# Patient Record
Sex: Female | Born: 1990 | Hispanic: No | Marital: Married | Smoking: Never smoker
Health system: Southern US, Community
[De-identification: ages and names within clinical notes are randomized; demographics above are authoritative.]

## PROBLEM LIST (undated history)

## (undated) DIAGNOSIS — E041 Nontoxic single thyroid nodule: Secondary | ICD-10-CM

## (undated) DIAGNOSIS — J45909 Unspecified asthma, uncomplicated: Secondary | ICD-10-CM

## (undated) HISTORY — PX: THYROIDECTOMY, PARTIAL: SHX18

---

## 2017-08-26 ENCOUNTER — Encounter (HOSPITAL_COMMUNITY): Payer: Self-pay

## 2017-09-10 ENCOUNTER — Other Ambulatory Visit (HOSPITAL_COMMUNITY): Payer: Self-pay | Admitting: Specialist

## 2017-09-10 DIAGNOSIS — Z3A18 18 weeks gestation of pregnancy: Secondary | ICD-10-CM

## 2017-09-10 DIAGNOSIS — O30042 Twin pregnancy, dichorionic/diamniotic, second trimester: Secondary | ICD-10-CM

## 2017-09-10 DIAGNOSIS — Z3689 Encounter for other specified antenatal screening: Secondary | ICD-10-CM

## 2017-10-09 ENCOUNTER — Encounter (HOSPITAL_COMMUNITY): Payer: Self-pay | Admitting: *Deleted

## 2017-10-13 ENCOUNTER — Encounter (HOSPITAL_COMMUNITY): Payer: Self-pay

## 2017-10-13 ENCOUNTER — Ambulatory Visit (HOSPITAL_COMMUNITY)
Admission: RE | Admit: 2017-10-13 | Discharge: 2017-10-13 | Disposition: A | Discharge status: Home / Self Care | Payer: PRIVATE HEALTH INSURANCE | Source: Ambulatory Visit | Attending: Specialist | Admitting: Specialist

## 2017-10-13 ENCOUNTER — Other Ambulatory Visit (HOSPITAL_COMMUNITY): Payer: Self-pay | Admitting: *Deleted

## 2017-10-13 DIAGNOSIS — Z363 Encounter for antenatal screening for malformations: Secondary | ICD-10-CM | POA: Insufficient documentation

## 2017-10-13 DIAGNOSIS — O30042 Twin pregnancy, dichorionic/diamniotic, second trimester: Secondary | ICD-10-CM | POA: Diagnosis present

## 2017-10-13 DIAGNOSIS — Z3A18 18 weeks gestation of pregnancy: Secondary | ICD-10-CM | POA: Insufficient documentation

## 2017-10-13 DIAGNOSIS — O30049 Twin pregnancy, dichorionic/diamniotic, unspecified trimester: Secondary | ICD-10-CM

## 2017-10-13 DIAGNOSIS — Z3689 Encounter for other specified antenatal screening: Secondary | ICD-10-CM

## 2017-10-13 HISTORY — DX: Nontoxic single thyroid nodule: E04.1

## 2017-10-13 HISTORY — DX: Unspecified asthma, uncomplicated: J45.909

## 2017-10-13 NOTE — Addendum Note (Signed)
Encounter addended by: Levonne Hubert, RDMS, RVT on: 10/13/2017 3:33 PM  Actions taken: Imaging Exam ended

## 2017-11-03 ENCOUNTER — Other Ambulatory Visit (HOSPITAL_COMMUNITY): Payer: Self-pay | Admitting: *Deleted

## 2017-11-04 ENCOUNTER — Other Ambulatory Visit (HOSPITAL_COMMUNITY): Payer: Self-pay | Admitting: *Deleted

## 2017-11-04 ENCOUNTER — Other Ambulatory Visit (HOSPITAL_COMMUNITY): Payer: Self-pay | Admitting: Obstetrics and Gynecology

## 2017-11-04 ENCOUNTER — Encounter (HOSPITAL_COMMUNITY): Payer: Self-pay

## 2017-11-04 ENCOUNTER — Ambulatory Visit (HOSPITAL_COMMUNITY)
Admission: RE | Admit: 2017-11-04 | Discharge: 2017-11-04 | Disposition: A | Discharge status: Home / Self Care | Payer: PRIVATE HEALTH INSURANCE | Source: Ambulatory Visit | Attending: Specialist | Admitting: Specialist

## 2017-11-04 DIAGNOSIS — Z3686 Encounter for antenatal screening for cervical length: Secondary | ICD-10-CM | POA: Diagnosis not present

## 2017-11-04 DIAGNOSIS — Z3A21 21 weeks gestation of pregnancy: Secondary | ICD-10-CM | POA: Diagnosis not present

## 2017-11-04 DIAGNOSIS — Z362 Encounter for other antenatal screening follow-up: Secondary | ICD-10-CM | POA: Diagnosis not present

## 2017-11-04 DIAGNOSIS — O30042 Twin pregnancy, dichorionic/diamniotic, second trimester: Secondary | ICD-10-CM

## 2017-11-04 DIAGNOSIS — O30049 Twin pregnancy, dichorionic/diamniotic, unspecified trimester: Secondary | ICD-10-CM

## 2017-11-04 NOTE — Addendum Note (Signed)
Encounter addended by: Lenise Arena, RT on: 11/04/2017 9:40 AM  Actions taken: Imaging Exam ended

## 2017-12-01 ENCOUNTER — Ambulatory Visit (HOSPITAL_COMMUNITY): Payer: PRIVATE HEALTH INSURANCE

## 2017-12-02 ENCOUNTER — Other Ambulatory Visit (HOSPITAL_COMMUNITY): Payer: Self-pay | Admitting: *Deleted

## 2017-12-02 ENCOUNTER — Ambulatory Visit (HOSPITAL_COMMUNITY): Payer: PRIVATE HEALTH INSURANCE

## 2017-12-02 ENCOUNTER — Encounter (HOSPITAL_COMMUNITY): Payer: Self-pay

## 2017-12-02 ENCOUNTER — Ambulatory Visit (HOSPITAL_COMMUNITY)
Admission: RE | Admit: 2017-12-02 | Discharge: 2017-12-02 | Disposition: A | Discharge status: Home / Self Care | Payer: PRIVATE HEALTH INSURANCE | Source: Ambulatory Visit | Attending: Specialist | Admitting: Specialist

## 2017-12-02 DIAGNOSIS — O30042 Twin pregnancy, dichorionic/diamniotic, second trimester: Secondary | ICD-10-CM | POA: Diagnosis present

## 2017-12-02 DIAGNOSIS — Z3A25 25 weeks gestation of pregnancy: Secondary | ICD-10-CM | POA: Insufficient documentation

## 2017-12-02 DIAGNOSIS — O30043 Twin pregnancy, dichorionic/diamniotic, third trimester: Secondary | ICD-10-CM

## 2017-12-30 ENCOUNTER — Other Ambulatory Visit (HOSPITAL_COMMUNITY): Payer: Self-pay | Admitting: Maternal & Fetal Medicine

## 2017-12-30 ENCOUNTER — Ambulatory Visit (HOSPITAL_COMMUNITY)
Admission: RE | Admit: 2017-12-30 | Discharge: 2017-12-30 | Disposition: A | Discharge status: Home / Self Care | Payer: PRIVATE HEALTH INSURANCE | Source: Ambulatory Visit | Attending: Specialist | Admitting: Specialist

## 2017-12-30 ENCOUNTER — Other Ambulatory Visit (HOSPITAL_COMMUNITY): Payer: Self-pay | Admitting: *Deleted

## 2017-12-30 ENCOUNTER — Encounter (HOSPITAL_COMMUNITY): Payer: Self-pay

## 2017-12-30 DIAGNOSIS — O30043 Twin pregnancy, dichorionic/diamniotic, third trimester: Secondary | ICD-10-CM

## 2017-12-30 DIAGNOSIS — Z3A29 29 weeks gestation of pregnancy: Secondary | ICD-10-CM | POA: Diagnosis not present

## 2018-01-27 ENCOUNTER — Ambulatory Visit (HOSPITAL_COMMUNITY)
Admission: RE | Admit: 2018-01-27 | Discharge: 2018-01-27 | Disposition: A | Discharge status: Home / Self Care | Payer: PRIVATE HEALTH INSURANCE | Source: Ambulatory Visit | Attending: Specialist | Admitting: Specialist

## 2018-01-27 ENCOUNTER — Encounter (HOSPITAL_COMMUNITY): Payer: Self-pay

## 2018-01-27 ENCOUNTER — Other Ambulatory Visit (HOSPITAL_COMMUNITY): Payer: Self-pay | Admitting: *Deleted

## 2018-01-27 DIAGNOSIS — O30043 Twin pregnancy, dichorionic/diamniotic, third trimester: Secondary | ICD-10-CM | POA: Insufficient documentation

## 2018-01-27 DIAGNOSIS — Z3A33 33 weeks gestation of pregnancy: Secondary | ICD-10-CM | POA: Diagnosis not present

## 2018-01-27 DIAGNOSIS — O30049 Twin pregnancy, dichorionic/diamniotic, unspecified trimester: Secondary | ICD-10-CM

## 2018-02-16 ENCOUNTER — Ambulatory Visit (HOSPITAL_COMMUNITY): Payer: PRIVATE HEALTH INSURANCE

## 2018-02-16 ENCOUNTER — Encounter (HOSPITAL_COMMUNITY): Payer: Self-pay

## 2018-06-05 ENCOUNTER — Encounter (HOSPITAL_COMMUNITY): Payer: Self-pay

## 2019-05-18 IMAGING — US US MFM OB DETAIL EACH ADDL GEST+14 WK
1 series · 16 of 28 positions shown · non-contrast
Comparison: none

[Series 1: us mfm ob detail each addl gest+14 wk · 119 acquisitions, 16 frames shown]
[im 1/119]
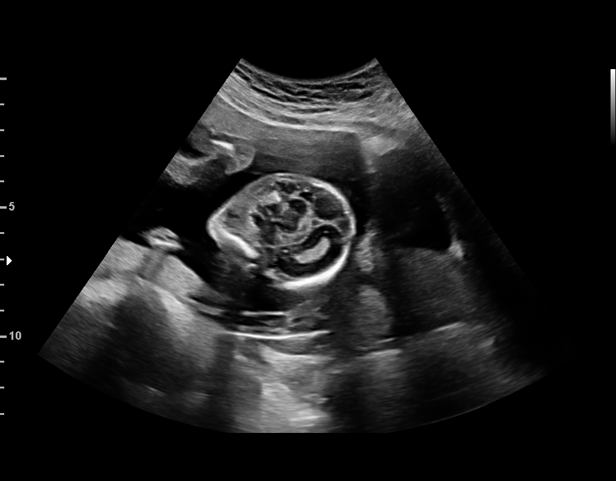
[im 9/119]
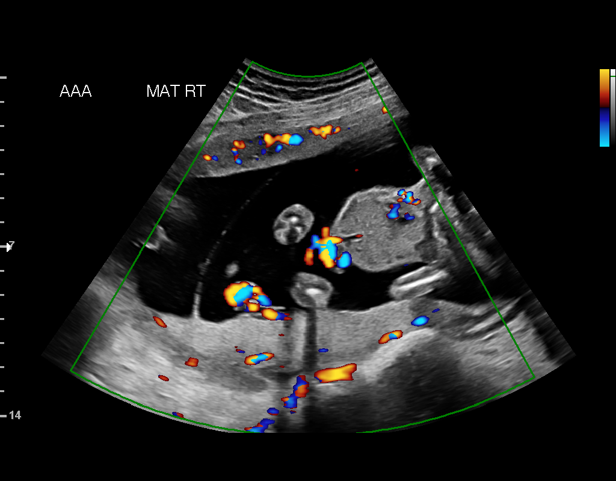
[im 18/119]
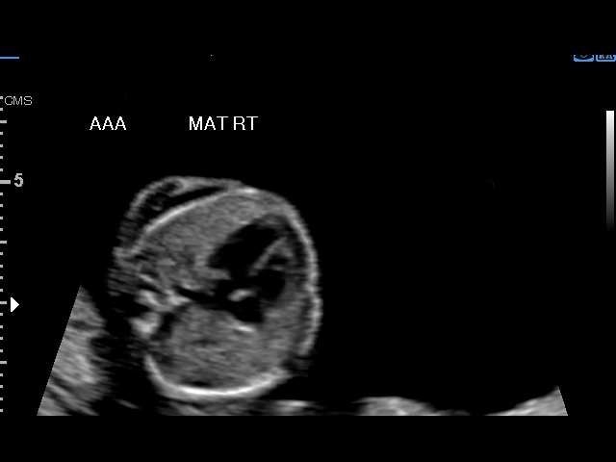
[im 27/119]
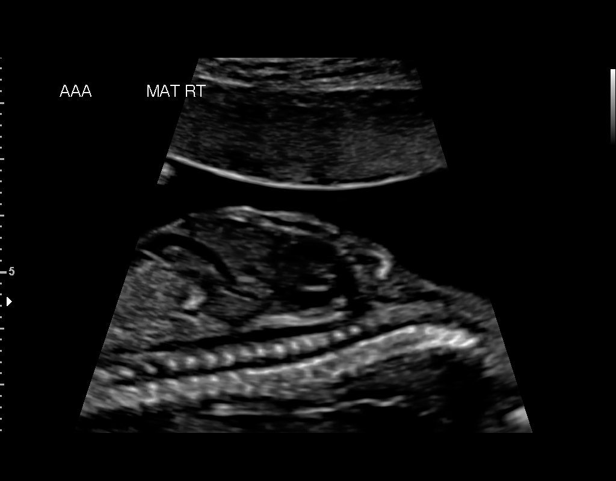
[im 31/119]
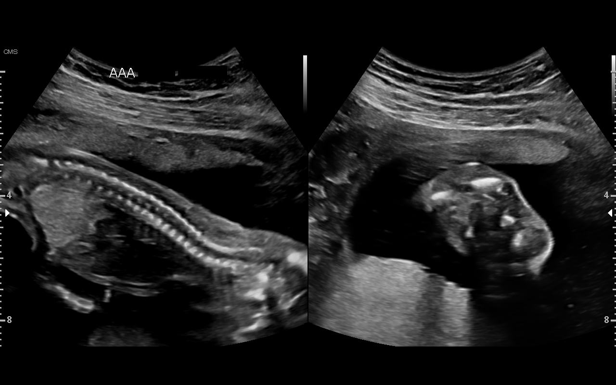
[im 40/119]
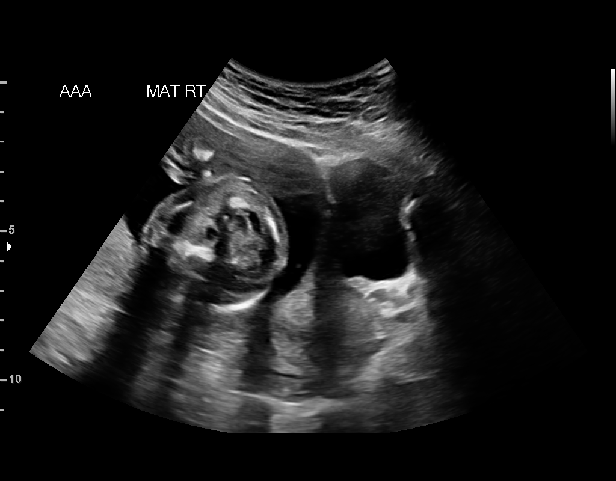
[im 49/119]
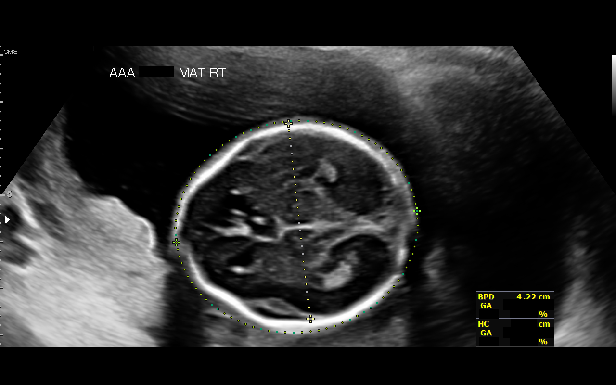
[im 57/119]
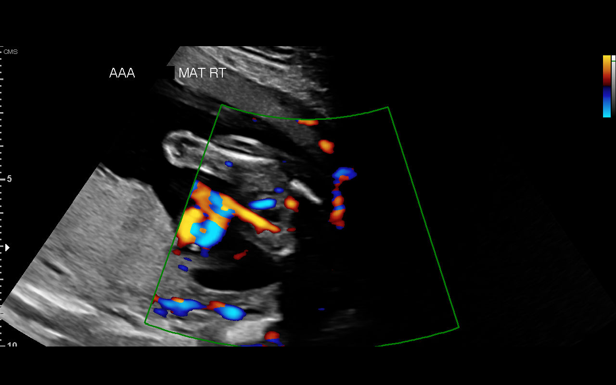
[im 62/119]
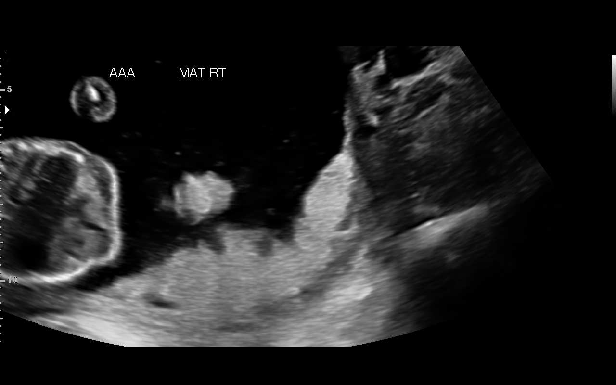
[im 70/119]
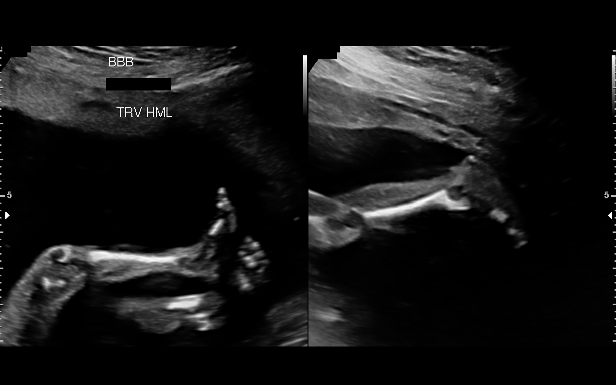
[im 79/119]
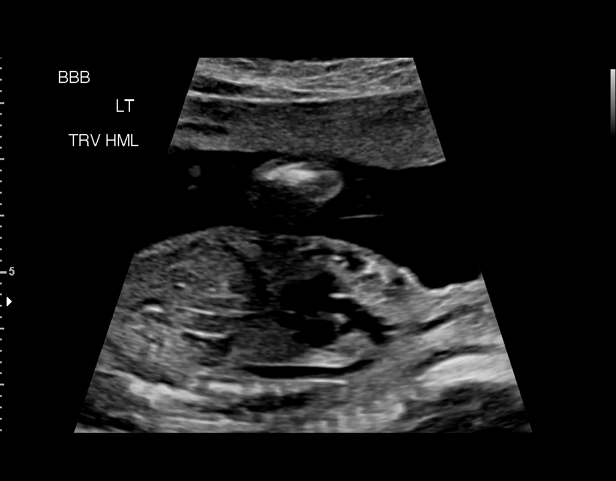
[im 88/119]
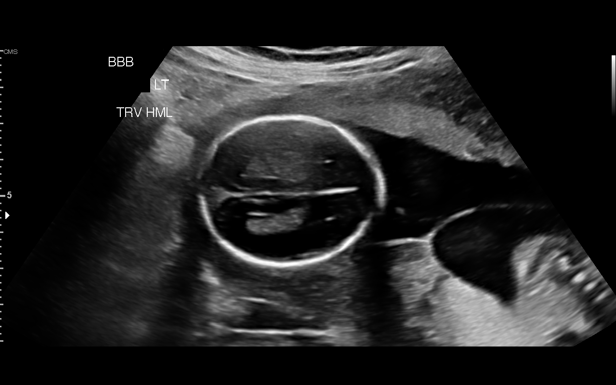
[im 92/119]
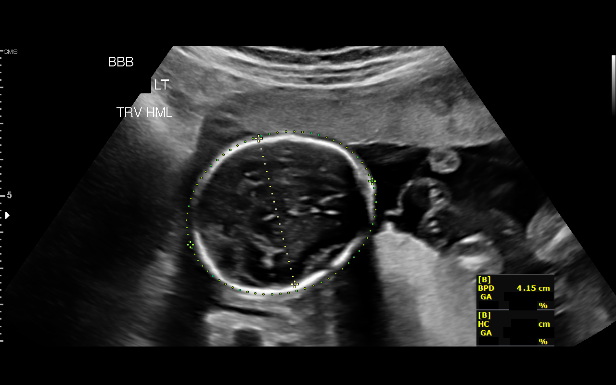
[im 101/119]
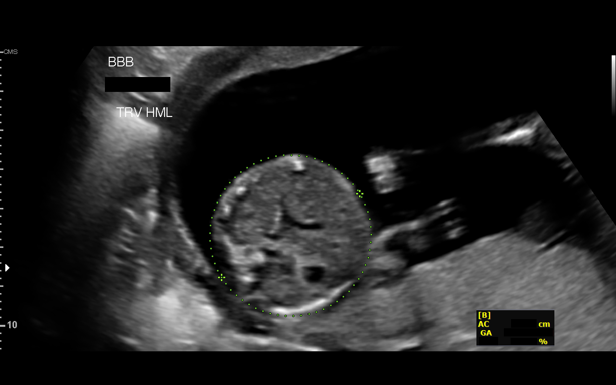
[im 110/119]
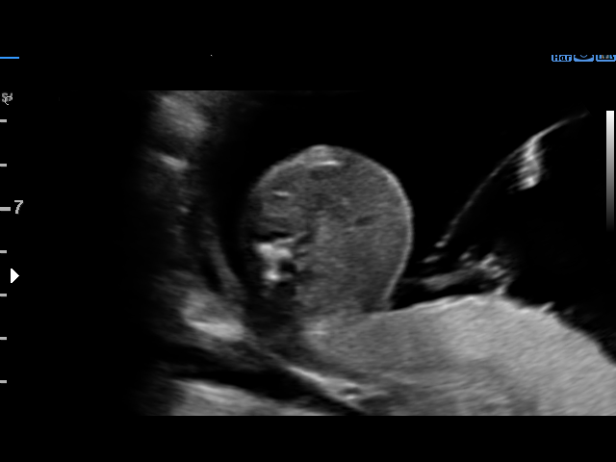
[im 119/119]
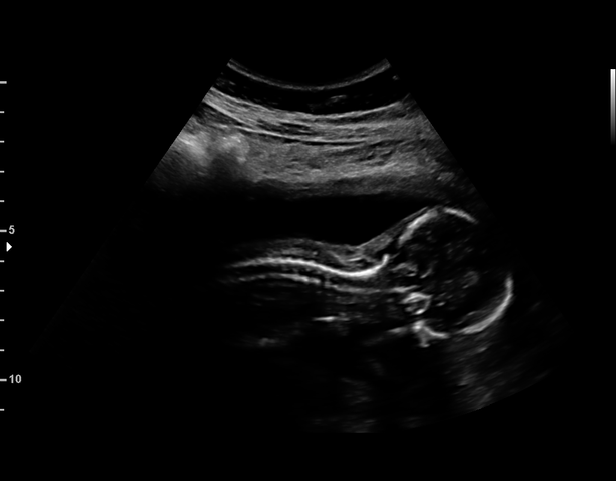

[16 of 28 positions shown; findings below may reference images not displayed]

JOXO

[REDACTED].

WK

1  CHAI TIGER             [id number]      [id number]     [id number]
2  CHAI TIGER             [id number]      [id number]     [id number]
Indications

18 weeks gestation of pregnancy
Encounter for antenatal screening for
malformations
Twin pregnancy, di/di, second trimester
OB History

Gravidity:    1         Term:   0        Prem:   0        SAB:   0
TOP:          0       Ectopic:  0        Living: 0
Fetal Evaluation (Fetus A)

Num Of Fetuses:     2
Fetal Heart         131
Rate(bpm):
Cardiac Activity:   Observed
Fetal Lie:          Lower Fetus
Presentation:       Cephalic
Placenta:           Posterior, above cervical os
P. Cord Insertion:  Visualized

Amniotic Fluid
AFI FV:      Subjectively within normal limits

Largest Pocket(cm)
5.29
Biometry (Fetus A)
BPD:      42.6  mm     G. Age:  18w 6d         71  %    CI:        77.66   %    70 - 86
FL/HC:      16.7   %    15.8 - 18
HC:       153   mm     G. Age:  18w 2d         36  %    HC/AC:      1.18        1.07 -
AC:      130.1  mm     G. Age:  18w 4d         51  %    FL/BPD:     59.9   %
FL:       25.5  mm     G. Age:  17w 5d         21  %    FL/AC:      19.6   %    20 - 24
HUM:      24.8  mm     G. Age:  17w 6d         33  %
CER:      19.3  mm     G. Age:  18w 5d         58  %
NFT:       3.5  mm
CM:        2.7  mm

Est. FW:     228  gm      0 lb 8 oz     41  %     FW Discordancy         7  %
Gestational Age (Fetus A)

LMP:           18w 3d        Date:  06/06/17                 EDD:   03/13/18
U/S Today:     18w 3d                                        EDD:   03/13/18
Best:          18w 3d     Det. By:  LMP  (06/06/17)          EDD:   03/13/18
Anatomy (Fetus A)

Cranium:               Appears normal         Aortic Arch:            Appears normal
Cavum:                 Appears normal         Ductal Arch:            Appears normal
Ventricles:            Appears normal         Diaphragm:              Appears normal
Choroid Plexus:        Appears normal         Stomach:                Appears normal, left
sided
Cerebellum:            Appears normal         Abdomen:                Appears normal
Posterior Fossa:       Appears normal         Abdominal Wall:         Appears nml (cord
insert, abd wall)
Nuchal Fold:           Appears normal         Cord Vessels:           Appears normal (3
vessel cord)
Face:                  Appears normal         Kidneys:                Appear normal
(orbits and profile)
Lips:                  Appears normal         Bladder:                Appears normal
Thoracic:              Appears normal         Spine:                  Appears normal
Heart:                 Appears normal         Upper Extremities:      Appears normal
(4CH, axis, and
situs)
RVOT:                  Appears normal         Lower Extremities:      Appears normal
LVOT:                  Appears normal

Other:  Parents do not wish to know sex of fetus today. Heels and 5th digit
visualized. Nasal bone visualized.

Fetal Evaluation (Fetus B)

Num Of Fetuses:     2
Fetal Heart         146
Rate(bpm):
Cardiac Activity:   Observed
Presentation:       Transverse, head to maternal left
Placenta:           Anterior, above cervical os
P. Cord Insertion:  Visualized

Amniotic Fluid
AFI FV:      Subjectively within normal limits

Largest Pocket(cm)
5.0
Biometry (Fetus B)

BPD:      41.2  mm     G. Age:  18w 3d         53  %    CI:        73.49   %    70 - 86
FL/HC:      18.4   %    15.8 - 18
HC:      152.7  mm     G. Age:  18w 2d         35  %    HC/AC:      1.17        1.07 -
AC:      130.6  mm     G. Age:  18w 4d         52  %    FL/BPD:     68.2   %
FL:       28.1  mm     G. Age:  18w 4d         51  %    FL/AC:      21.5   %    20 - 24
HUM:      28.9  mm     G. Age:  19w 3d         79  %
CER:      18.1  mm     G. Age:  18w 0d         36  %
NFT:       4.1  mm

CM:        4.9  mm

Est. FW:     246  gm      0 lb 9 oz     50  %     FW Discordancy      0 \ 7 %
Gestational Age (Fetus B)

LMP:           18w 3d        Date:  06/06/17                 EDD:   03/13/18
U/S Today:     18w 3d                                        EDD:   03/13/18
Best:          18w 3d     Det. By:  LMP  (06/06/17)          EDD:   03/13/18
Anatomy (Fetus B)

Cranium:               Appears normal         Aortic Arch:            Appears normal
Cavum:                 Appears normal         Ductal Arch:            Appears normal
Ventricles:            Appears normal         Diaphragm:              Appears normal
Choroid Plexus:        Appears normal         Stomach:                Appears normal, left
sided
Cerebellum:            Appears normal         Abdomen:                Appears normal
Posterior Fossa:       Appears normal         Abdominal Wall:         Appears nml (cord
insert, abd wall)
Nuchal Fold:           Appears normal         Cord Vessels:           Appears normal (3
vessel cord)
Face:                  Appears normal         Kidneys:                Appear normal
(orbits and profile)
Lips:                  Appears normal         Bladder:                Appears normal
Thoracic:              Appears normal         Spine:                  Not well visualized
Heart:                 Appears normal         Upper Extremities:      Appears normal
(4CH, axis, and
situs)
RVOT:                  Appears normal         Lower Extremities:      Appears normal
LVOT:                  Appears normal

Other:  Parents do not wish to know sex of fetus today. Heels and 5th digit
visualized. Nasal bone visualized. Technically difficult due to fetal
position.
Cervix Uterus Adnexa

Cervix
Length:            3.4  cm.
Normal appearance by transabdominal scan.

Uterus
No abnormality visualized.

Left Ovary
Within normal limits.

Right Ovary
Within normal limits.
Cul De Sac:   No free fluid seen.
Adnexa:       No abnormality visualized.
Impression

Diamniotic Dichorionic intrauterine pregnancy at 18+3 weeks
Presentation is cephalic/transverse
Normal amniotic fluid in each gestational sac
Estimated fetal weights are 228g for A and 246g for B, 41st
and 50th percentiles respectively. Discordance is 7%
Review of anatomy shows no evidence of structural
anomalies or sonographic markers for aneuploidy
Evaluation of the spine of B is suboptimal due to fetal position
Recommendations

Recommend follow-up ultrasound examination in 4 weeks for
completion of the anatomic survey

## 2019-06-09 IMAGING — US US MFM OB TRANSVAGINAL
1 series · 12 of 28 positions shown · non-contrast
Comparison: none

[Series 1: us mfm ob transvaginal · 126 acquisitions, 12 frames shown]
[im 5/126]
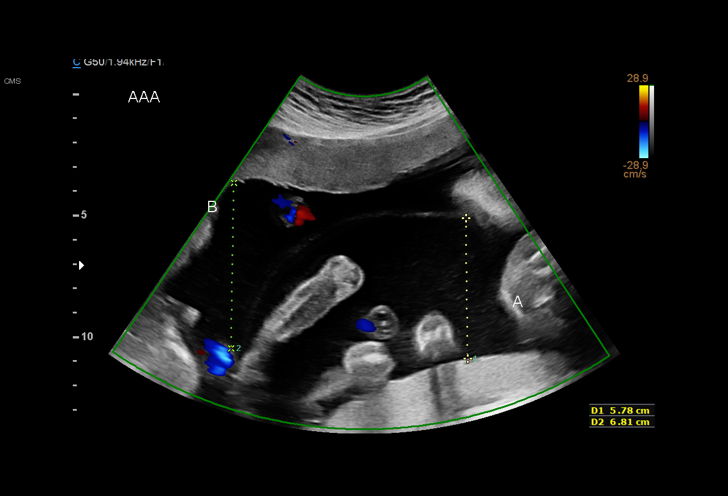
[im 14/126]
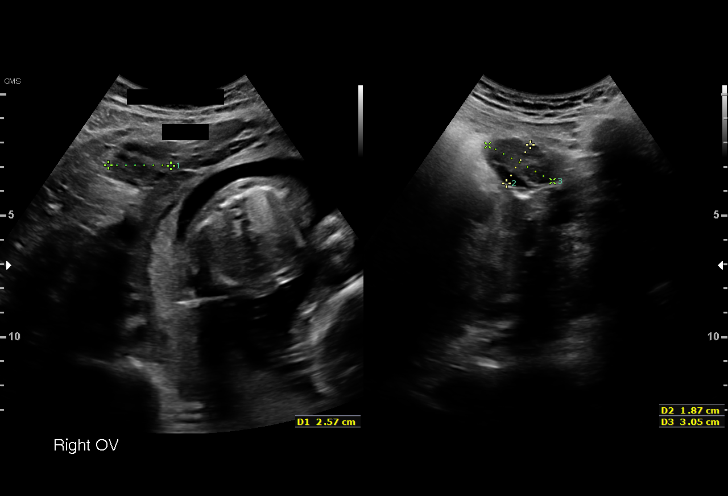
[im 24/126]
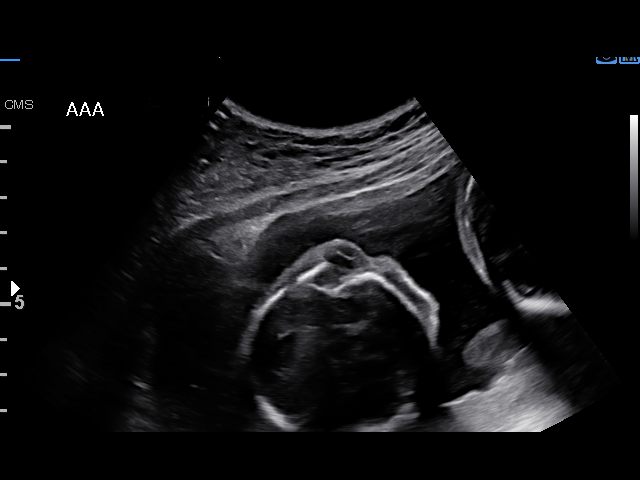
[im 38/126]
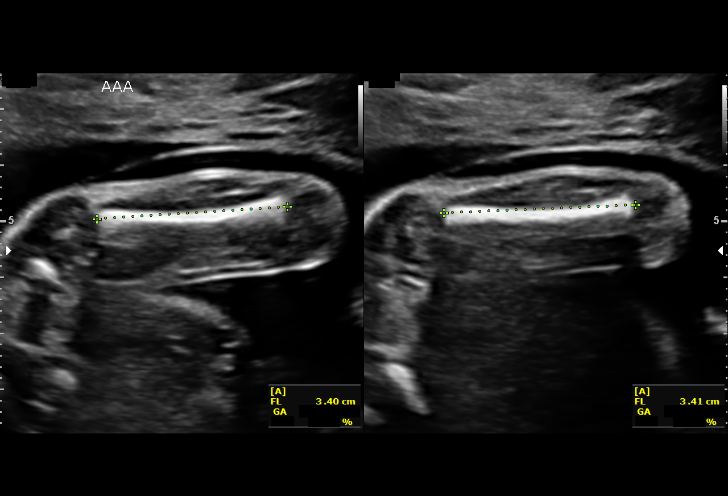
[im 47/126]
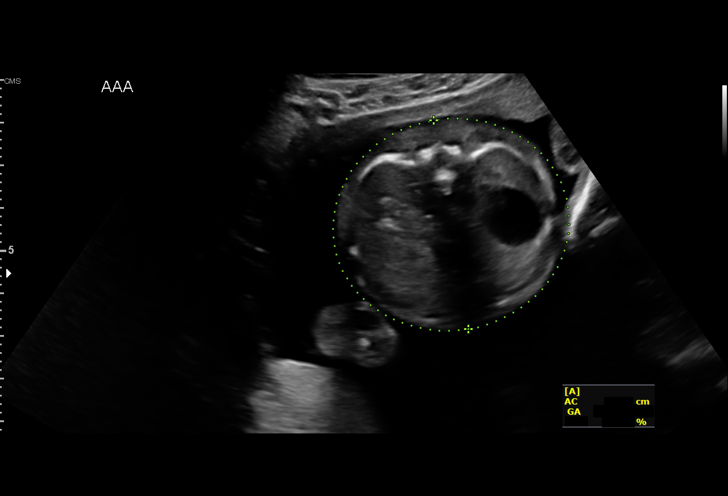
[im 56/126]
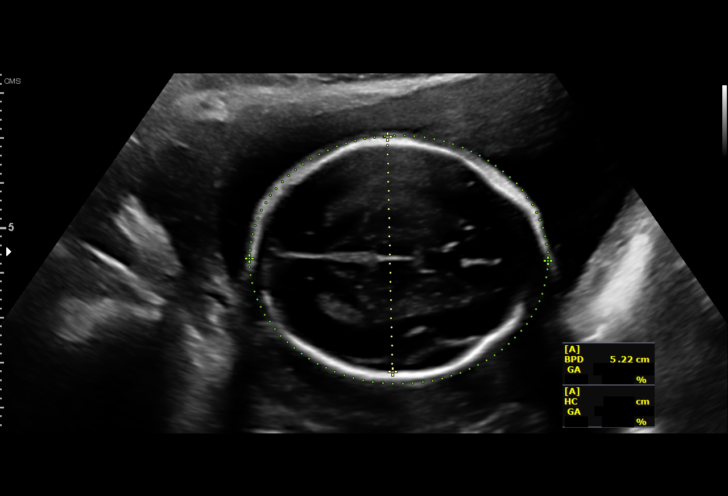
[im 70/126]
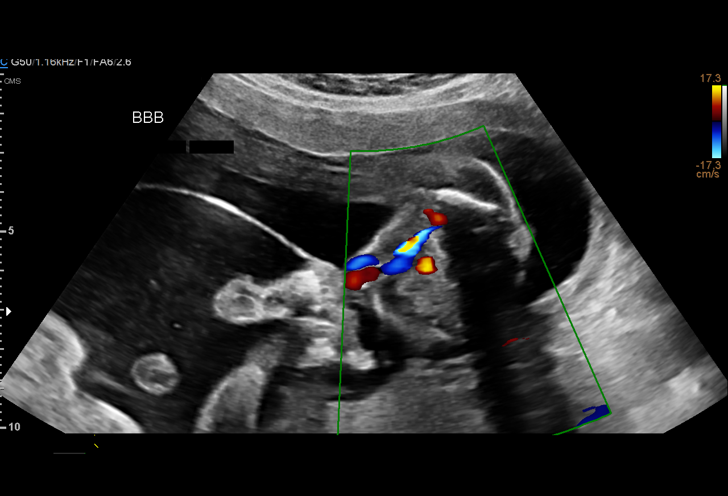
[im 79/126]
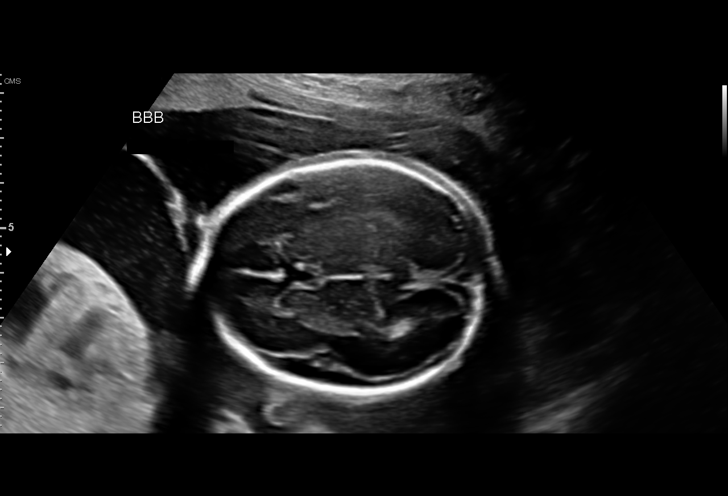
[im 88/126]
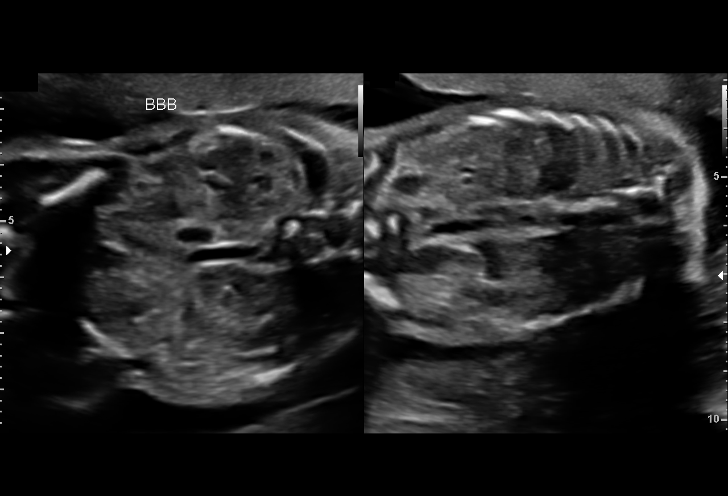
[im 102/126]
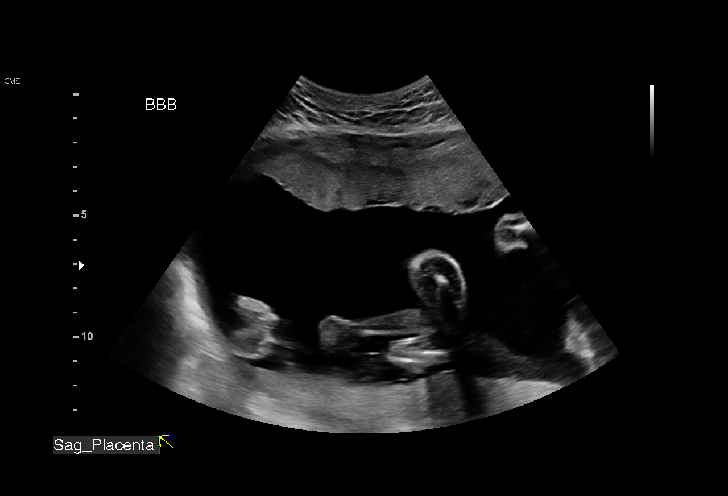
[im 112/126]
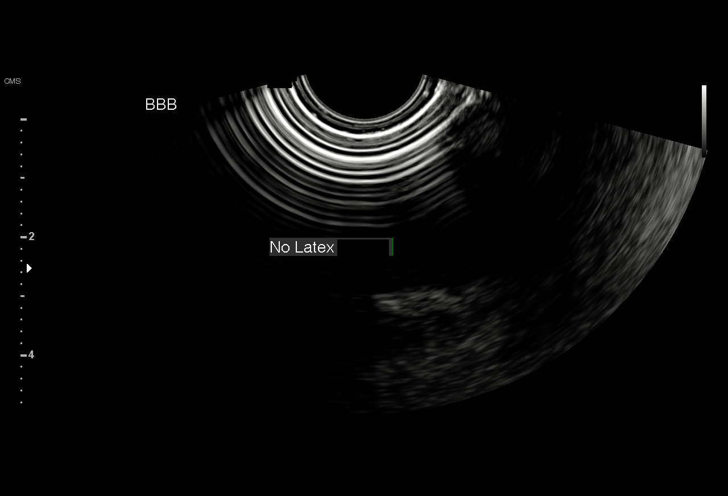
[im 121/126]
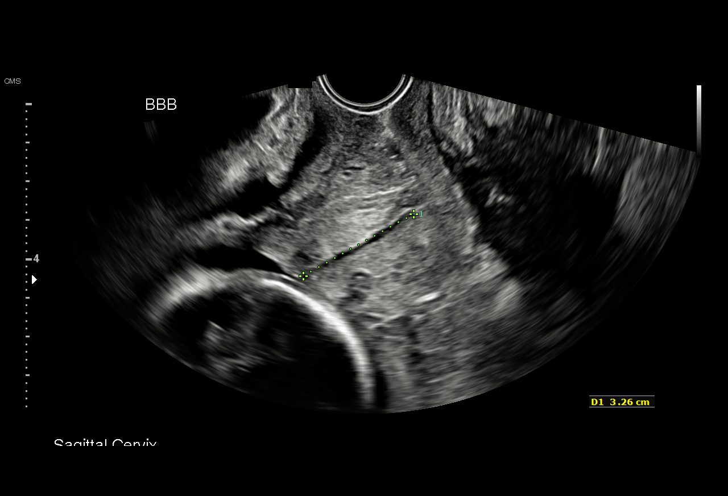

[12 of 28 positions shown; findings below may reference images not displayed]

am)

ESTUARDO

[REDACTED].

1  KLPIGBB MOOLMAN              [id number]      [id number]     [id number]
2  KLPIGBB MOOLMAN              [id number]      [id number]     [id number]
3  KLPIGBB MOOLMAN              [id number]      [id number]     [id number]
Indications

21 weeks gestation of pregnancy
Twin pregnancy, di/di, second trimester
Encounter for other antenatal screening
follow-up
Encounter for cervical length
OB History

Gravidity:    1         Term:   0        Prem:   0        SAB:   0
TOP:          0       Ectopic:  0        Living: 0
Fetal Evaluation (Fetus A)

Num Of Fetuses:     2
Fetal Heart         158
Rate(bpm):
Cardiac Activity:   Observed
Fetal Lie:          Lower Fetus
Presentation:       Cephalic
Placenta:           Posterior, above cervical os
P. Cord Insertion:  Visualized, central

Amniotic Fluid
AFI FV:      Subjectively within normal limits

Largest Pocket(cm)
5.78
Biometry (Fetus A)

BPD:      52.7  mm     G. Age:  22w 0d         65  %    CI:        75.59   %    70 - 86
FL/HC:      17.7   %    18.4 -
HC:      192.2  mm     G. Age:  21w 4d         36  %    HC/AC:      1.17        1.06 -
AC:      163.6  mm     G. Age:  21w 3d         38  %    FL/BPD:     64.7   %    71 - 87
FL:       34.1  mm     G. Age:  20w 5d         16  %    FL/AC:      20.8   %    20 - 24
HUM:        32  mm     G. Age:  20w 5d         26  %
CER:      22.2  mm     G. Age:  21w 1d         34  %

Est. FW:     404  gm    0 lb 14 oz      35  %     FW Discordancy        15  %
Gestational Age (Fetus A)

LMP:           21w 4d        Date:  06/06/17                 EDD:   03/13/18
U/S Today:     21w 3d                                        EDD:   03/14/18
Best:          21w 4d     Det. By:  LMP  (06/06/17)          EDD:   03/13/18
Anatomy (Fetus A)

Cranium:               Appears normal         Aortic Arch:            Appears normal
Cavum:                 Appears normal         Ductal Arch:            Appears normal
Ventricles:            Appears normal         Diaphragm:              Previously seen
Choroid Plexus:        Appears normal         Stomach:                Appears normal, left
sided
Cerebellum:            Appears normal         Abdomen:                Appears normal
Posterior Fossa:       Appears normal         Abdominal Wall:         Appears nml (cord
insert, abd wall)
Nuchal Fold:           Previously seen        Cord Vessels:           Appears normal (3
vessel cord)
Face:                  Appears normal         Kidneys:                Appear normal
(orbits and profile)
Lips:                  Appears normal         Bladder:                Appears normal
Thoracic:              Appears normal         Spine:                  Previously seen
Heart:                 Appears normal         Upper Extremities:      Previously seen
(4CH, axis, and
situs)
RVOT:                  Appears normal         Lower Extremities:      Previously seen
LVOT:                  Appears normal

Other:  Female gender. Heels and 5th digit previously visualized. Nasal bone
visualized.

Fetal Evaluation (Fetus B)

Num Of Fetuses:     2
Fetal Heart         150
Rate(bpm):
Cardiac Activity:   Observed
Fetal Lie:          Upper Fetus
Presentation:       Cephalic
Placenta:           Anterior, above cervical os
P. Cord Insertion:  Visualized

Amniotic Fluid
AFI FV:      Subjectively within normal limits

Largest Pocket(cm)
6.81
Biometry (Fetus B)

BPD:      51.3  mm     G. Age:  21w 4d         47  %    CI:        70.55   %    70 - 86
FL/HC:      19.4   %    18.4 -
HC:      194.7  mm     G. Age:  21w 5d         45  %    HC/AC:      1.12        1.06 -
AC:      173.5  mm     G. Age:  22w 2d         66  %    FL/BPD:     73.7   %    71 - 87
FL:       37.8  mm     G. Age:  22w 1d         58  %    FL/AC:      21.8   %    20 - 24
HUM:      35.9  mm     G. Age:  22w 4d         70  %
CER:      22.7  mm     G. Age:  21w 2d         42  %

CM:        6.7  mm

Est. FW:     477  gm      1 lb 1 oz     54  %     FW Discordancy     0 \ 15 %
Gestational Age (Fetus B)

LMP:           21w 4d        Date:  06/06/17                 EDD:   03/13/18
U/S Today:     22w 0d                                        EDD:   03/10/18
Best:          21w 4d     Det. By:  LMP  (06/06/17)          EDD:   03/13/18
Anatomy (Fetus B)

Cranium:               Appears normal         Aortic Arch:            Previously seen
Cavum:                 Appears normal         Ductal Arch:            Previously seen
Ventricles:            Appears normal         Diaphragm:              Appears normal
Choroid Plexus:        Appears normal         Stomach:                Appears normal, left
sided
Cerebellum:            Appears normal         Abdomen:                Appears normal
Posterior Fossa:       Appears normal         Abdominal Wall:         Appears nml (cord
insert, abd wall)
Nuchal Fold:           Previously seen        Cord Vessels:           Appears normal (3
vessel cord)
Face:                  Appears normal         Kidneys:                Appear normal
(orbits and profile)
Lips:                  Previously seen        Bladder:                Appears normal
Thoracic:              Appears normal         Spine:                  Appears normal
Heart:                 Appears normal         Upper Extremities:      Previously seen
(4CH, axis, and
situs)
RVOT:                  Appears normal         Lower Extremities:      Previously seen
LVOT:                  Appears normal

Other:  Male gender. Heels and 5th digits previously visualized. Nasal bone
visualized. Technically difficult due to fetal position.
Cervix Uterus Adnexa

Cervix
Length:            3.1  cm.
Normal appearance by transvaginal scan

Uterus
No abnormality visualized.

Left Ovary
Within normal limits.

Right Ovary
Within normal limits.

Cul De Sac:   No free fluid seen.
Adnexa:       No abnormality visualized.
Impression

Diamniotic Dichorionic intrauterine pregnancy at 21+4 weeks
Presentation is cephalic/cephalic
Normal amniotic fluid in each gestational sac
Estimated fetal weights are 404g for A and 477g for B, 35th
and 54th percentiles respectively. Discordance is 15%
Review of anatomy shows no evidence of structural
anomalies or sonographic markers for aneuploidy
Evaluation of the spine of B was completed on today's scan
TVCL was 31 mm without funnelling
Recommendations

Recommend follow-up ultrasound examination in 4 weeks to
assess fetal growth
